# Patient Record
Sex: Female | Born: 1950 | Race: White | Hispanic: No | Smoking: Former smoker
Health system: Southern US, Community
[De-identification: ages and names within clinical notes are randomized; demographics above are authoritative.]

## PROBLEM LIST (undated history)

## (undated) HISTORY — PX: BREAST EXCISIONAL BIOPSY: SUR124

## (undated) HISTORY — PX: ABDOMINAL HYSTERECTOMY: SHX81

## (undated) HISTORY — PX: OOPHORECTOMY: SHX86

---

## 2015-12-19 DIAGNOSIS — D367 Benign neoplasm of other specified sites: Secondary | ICD-10-CM | POA: Insufficient documentation

## 2015-12-19 DIAGNOSIS — M2140 Flat foot [pes planus] (acquired), unspecified foot: Secondary | ICD-10-CM | POA: Insufficient documentation

## 2015-12-19 DIAGNOSIS — M2030 Hallux varus (acquired), unspecified foot: Secondary | ICD-10-CM | POA: Insufficient documentation

## 2017-10-18 DIAGNOSIS — M1990 Unspecified osteoarthritis, unspecified site: Secondary | ICD-10-CM | POA: Insufficient documentation

## 2017-10-18 DIAGNOSIS — N281 Cyst of kidney, acquired: Secondary | ICD-10-CM | POA: Insufficient documentation

## 2019-03-24 DIAGNOSIS — Z8709 Personal history of other diseases of the respiratory system: Secondary | ICD-10-CM | POA: Insufficient documentation

## 2019-04-10 ENCOUNTER — Telehealth: Payer: Self-pay | Admitting: Pulmonary Disease

## 2019-04-10 NOTE — Telephone Encounter (Signed)
Called patient for COVID-19 pre-screening for in office visit.  Have you recently traveled any where out of the local area in the last 2 weeks? No  Have you been in close contact with a person diagnosed with COVID-19 or someone awaiting results within the last 2 weeks? No  Do you currently have any of the following symptoms? If so, when did they start?  Cough (Yes-  occasional dry cough- couple days)       Diarrhea Joint Pain Fever      Muscle Pain   Red eyes Shortness of breath (Yes- a month)  Abdominal pain  Vomiting Loss of smell    Rash    Sore Throat Headache    Weakness   Bruising or bleeding  Denies any other symptoms and cough is very occational  Okay to proceed with visit. (date)  / Needs to reschedule visit. (date)

## 2019-04-10 NOTE — Telephone Encounter (Signed)
Okay to proceed.  

## 2019-04-13 ENCOUNTER — Other Ambulatory Visit: Payer: Self-pay

## 2019-04-13 ENCOUNTER — Ambulatory Visit: Payer: Medicare Other | Admitting: Pulmonary Disease

## 2019-04-13 ENCOUNTER — Telehealth: Payer: Self-pay

## 2019-04-13 ENCOUNTER — Encounter: Payer: Self-pay | Admitting: Pulmonary Disease

## 2019-04-13 VITALS — BP 128/74 | HR 92 | Temp 98.0°F | Ht 65.0 in | Wt 123.8 lb

## 2019-04-13 DIAGNOSIS — R0789 Other chest pain: Secondary | ICD-10-CM | POA: Diagnosis not present

## 2019-04-13 DIAGNOSIS — R0602 Shortness of breath: Secondary | ICD-10-CM | POA: Diagnosis not present

## 2019-04-13 DIAGNOSIS — R079 Chest pain, unspecified: Secondary | ICD-10-CM

## 2019-04-13 DIAGNOSIS — Z87891 Personal history of nicotine dependence: Secondary | ICD-10-CM | POA: Diagnosis not present

## 2019-04-13 MED ORDER — BREO ELLIPTA 200-25 MCG/INH IN AEPB: 1.0000 | INHALATION_SPRAY | Freq: Every day | RESPIRATORY_TRACT | 5 refills | Status: DC

## 2019-04-13 MED ORDER — BREO ELLIPTA 200-25 MCG/INH IN AEPB: 1.0000 | INHALATION_SPRAY | Freq: Every day | RESPIRATORY_TRACT | 0 refills | Status: DC

## 2019-04-13 NOTE — Progress Notes (Signed)
PULMONARY CONSULT NOTE  Requesting MD/Service: Linthavong Date of initial consultation: 04/13/19 Reason for consultation: Dyspnea, chest pressure  PT PROFILE: 68 y.o. female former smoker (20-pack-year history, quit age 57) with no prior pulmonary history referred for one month of persistent exertional dyspnea and chest pressure/tightness  DATA:  INTERVAL:  HPI:  She reports shortness of breath and chest tightness (described as "pressure") for 1 month duration.  The symptoms are present with any exertion.  They are not associated with other pulmonary symptoms.  She denies fever, cough, hemoptysis, pleurodynia.  She does not have associated diaphoresis or nausea.  The chest pressure resolves after several minutes of rest.  The symptoms have become debilitating as she is now very anxious and fearful of exerting herself.  Other than exertion, she has not been able to identify any exacerbating factors.  She has been prescribed an albuterol inhaler which she believes provides her some short-lived relief.  She has been prescribed prednisone and methylprednisolone which she believes has been beneficial.  However, on further questioning she states that these medications just "make her feel better".  It is not entirely clear that they are providing benefit with regard to her respiratory symptoms.  She believes that her symptoms relapse within a day or 2 of discontinuing systemic corticosteroids.  She notes that as a young adult, while living in [place], she had seasonal allergies and would occasionally develop shortness of breath as well.  In the remote past she has been prescribed an albuterol inhaler.  However, she is never been given a formal diagnosis of asthma.  She has no sick contacts.  She tested negative for SARS-CoV-2 on 03/28/2019.   Besides remote smoking history, she has no significant coronary risk factors.  She denies history of hypertension, hyperlipidemia, diabetes.  There is no strong  family history of coronary disease.  Her only chronic medications are Nexium and as needed albuterol inhaler.  She has no history of significant occupational or environmental exposures.  She does have dogs in the home.  She lived in [place] until 2004.  There is no significant travel history.  There is no history of tuberculosis exposure.  She reports that she had a chest x-ray performed at an urgent care center on July 10.  Per her verbal report, this only showed mild hyperinflation with no acute findings.  I do not have access to this film for review.  History reviewed. No pertinent past medical history.  History reviewed. No pertinent surgical history.  MEDICATIONS: I have reviewed all medications and confirmed regimen as documented  Social History   Socioeconomic History  . Marital status: Divorced    Spouse name: Not on file  . Number of children: Not on file  . Years of education: Not on file  . Highest education level: Not on file  Occupational History  . Not on file  Social Needs  . Financial resource strain: Not on file  . Food insecurity    Worry: Not on file    Inability: Not on file  . Transportation needs    Medical: Not on file    Non-medical: Not on file  Tobacco Use  . Smoking status: Former Smoker    Packs/day: 1.00    Years: 20.00    Pack years: 20.00    Quit date: 09/07/1988    Years since quitting: 30.6  . Smokeless tobacco: Never Used  Substance and Sexual Activity  . Alcohol use: Not on file  . Drug  use: Not on file  . Sexual activity: Not on file  Lifestyle  . Physical activity    Days per week: Not on file    Minutes per session: Not on file  . Stress: Not on file  Relationships  . Social Herbalist on phone: Not on file    Gets together: Not on file    Attends religious service: Not on file    Active member of club or organization: Not on file    Attends meetings of clubs or organizations: Not on file    Relationship  status: Not on file  . Intimate partner violence    Fear of current or ex partner: Not on file    Emotionally abused: Not on file    Physically abused: Not on file    Forced sexual activity: Not on file  Other Topics Concern  . Not on file  Social History Narrative  . Not on file    Family History  Problem Relation Age of Onset  . Asthma Brother   . Lung cancer Father        smoked  . Heart disease Father     ROS: No fever, myalgias/arthralgias, unexplained weight loss or weight gain No new focal weakness or sensory deficits No otalgia, hearing loss, visual changes, nasal and sinus symptoms, mouth and throat problems No neck pain or adenopathy No abdominal pain, N/V/D, diarrhea, change in bowel pattern No dysuria, change in urinary pattern   Vitals:   04/13/19 1017  BP: 128/74  Pulse: 92  Temp: 98 F (36.7 C)  TempSrc: Oral  SpO2: 98%  Weight: 123 lb 12.8 oz (56.2 kg)  Height: 5\' 5"  (1.651 m)     EXAM:  Gen: WDWN, No overt respiratory distress.  Somewhat anxious appearing HEENT: NCAT, sclera white, tympanic canals and membranes normal, nares patent with very mild rhinitis, oropharynx normal Neck: Supple without LAN, thyromegaly, JVD Lungs: breath sounds full without wheezes or other adventitious sounds Cardiovascular: RRR, no murmurs noted Abdomen: Soft, nontender, normal BS Ext: without clubbing, cyanosis, edema Neuro: CNs grossly intact, motor and sensory intact Skin: Limited exam, no lesions noted  DATA:   No flowsheet data found.  No flowsheet data found.  CXR: None available for my review  EKG: NSR, entirely normal  I have personally reviewed all chest radiographs reported above including CXRs and CT chest unless otherwise indicated  IMPRESSION:     ICD-10-CM   1. Chest pressure  R07.89 CANCELED: EKG 12-Lead  2. SOB (shortness of breath)  R06.02 Pulmonary Function Test [facility] Only    DG Chest 2 View  3. Former smoker  Z87.891    Differential  diagnosis includes airways disease such as asthma or asthmatic bronchitis, ischemic heart disease.  Less likely diagnoses include pulmonary hypertension and pulmonary embolism. She has no risk factors for venous thromboembolism.  She has no exam or EKG findings to suggest pulmonary hypertension.    PLAN:  Diagnostic studies ordered: 1) Myoview.  I discussed with Dr Fletcher Anon who will review this study once completed and will contact her to arrange follow up 2) PFTs (lung function test) - hopefully prior to follow-up visit 3) chest x-ray to be done prior to follow-up visit  New prescription: Breo 200-25, 1 inhalation daily.  Rinse mouth after use.  Sample provided.  Prescription entered.  Continue albuterol rescue inhaler as needed for increased shortness of breath, chest tightness, wheezing, cough  Follow-up first week of September.  Call sooner if needed    Merton Border, MD PCCM service Mobile [phone] Pager [phone] 04/13/2019 11:48 AM

## 2019-04-13 NOTE — Patient Instructions (Addendum)
Diagnostic studies ordered: 1) Myoview (heart scan). Dr Fletcher Anon will review this study once completed and will contact you to arrange follow up with him 2) PFTs (lung function test) - hopefully prior to follow-up visit 3) chest x-ray to be done prior to follow-up visit  New prescription: Breo 200-25, 1 inhalation daily.  Rinse mouth after use.  Sample provided.  Prescription entered.  Continue albuterol rescue inhaler as needed for increased shortness of breath, chest tightness, wheezing, cough  Follow-up first week of September.  Call sooner if needed

## 2019-04-13 NOTE — Telephone Encounter (Signed)
Pt is aware of date/time of covid test. Nothing further is needed.  

## 2019-04-13 NOTE — Progress Notes (Signed)
Patient of Dr.Simonds. Dr.Simmonds discussed with Dr.Arida

## 2019-04-14 ENCOUNTER — Ambulatory Visit
Admission: RE | Admit: 2019-04-14 | Discharge: 2019-04-14 | Disposition: A | Discharge status: Home / Self Care | Payer: Medicare Other | Source: Ambulatory Visit | Attending: Cardiovascular Disease | Admitting: Cardiovascular Disease

## 2019-04-14 ENCOUNTER — Other Ambulatory Visit
Admission: RE | Admit: 2019-04-14 | Discharge: 2019-04-14 | Disposition: A | Discharge status: Home / Self Care | Payer: Medicare Other | Source: Ambulatory Visit | Attending: Pulmonary Disease | Admitting: Pulmonary Disease

## 2019-04-14 DIAGNOSIS — R079 Chest pain, unspecified: Secondary | ICD-10-CM | POA: Insufficient documentation

## 2019-04-14 DIAGNOSIS — Z20828 Contact with and (suspected) exposure to other viral communicable diseases: Secondary | ICD-10-CM | POA: Insufficient documentation

## 2019-04-14 LAB — NM MYOCAR MULTI W/SPECT W/WALL MOTION / EF
Estimated workload: 1 METS
Exercise duration (min): 0 min
Exercise duration (sec): 0 s
LV dias vol: 31 mL (ref 46–106)
LV sys vol: 10 mL
MPHR: 153 {beats}/min
Peak HR: 134 {beats}/min
Percent HR: 87 %
Rest HR: 82 {beats}/min
SDS: 0
SRS: 0
SSS: 1
TID: 0.66

## 2019-04-14 MED ORDER — TECHNETIUM TC 99M TETROFOSMIN IV KIT
32.5490 | PACK | Freq: Once | INTRAVENOUS | Status: AC | PRN
  Administered 2019-04-14: 32.549 via INTRAVENOUS

## 2019-04-14 MED ORDER — TECHNETIUM TC 99M TETROFOSMIN IV KIT
10.2310 | PACK | Freq: Once | INTRAVENOUS | Status: AC | PRN
  Administered 2019-04-14: 10.231 via INTRAVENOUS

## 2019-04-14 MED ORDER — REGADENOSON 0.4 MG/5ML IV SOLN
0.4000 mg | Freq: Once | INTRAVENOUS | Status: AC
  Administered 2019-04-14: 09:00:00 0.4 mg via INTRAVENOUS

## 2019-04-15 LAB — SARS CORONAVIRUS 2 (TAT 6-24 HRS): SARS Coronavirus 2: NEGATIVE

## 2019-04-17 ENCOUNTER — Ambulatory Visit: Payer: Medicare Other | Attending: Pulmonary Disease

## 2019-04-17 ENCOUNTER — Other Ambulatory Visit: Payer: Self-pay

## 2019-04-17 DIAGNOSIS — R0602 Shortness of breath: Secondary | ICD-10-CM | POA: Insufficient documentation

## 2019-04-19 ENCOUNTER — Telehealth: Payer: Self-pay

## 2019-04-19 NOTE — Telephone Encounter (Signed)
-----   Message from Wellington Hampshire, MD sent at 04/17/2019  2:07 PM EDT ----- Inform patient that  stress test was normal.

## 2019-04-19 NOTE — Telephone Encounter (Signed)
Pt made aware of stress test results with verbalized understanding.

## 2019-04-21 ENCOUNTER — Encounter

## 2019-04-21 ENCOUNTER — Other Ambulatory Visit: Payer: Self-pay

## 2019-04-21 ENCOUNTER — Ambulatory Visit: Payer: Medicare Other | Admitting: Cardiovascular Disease

## 2019-04-21 ENCOUNTER — Encounter: Payer: Self-pay | Admitting: Cardiovascular Disease

## 2019-04-21 VITALS — BP 140/80 | HR 81 | Ht 65.0 in | Wt 124.0 lb

## 2019-04-21 DIAGNOSIS — R079 Chest pain, unspecified: Secondary | ICD-10-CM

## 2019-04-21 DIAGNOSIS — R0602 Shortness of breath: Secondary | ICD-10-CM | POA: Diagnosis not present

## 2019-04-21 NOTE — Patient Instructions (Signed)
Medication Instructions:  Your physician recommends that you continue on your current medications as directed. Please refer to the Current Medication list given to you today.  If you need a refill on your cardiac medications before your next appointment, please call your pharmacy.   Lab work: None ordered If you have labs (blood work) drawn today and your tests are completely normal, you will receive your results only by: Marland Kitchen MyChart Message (if you have MyChart) OR . A paper copy in the mail If you have any lab test that is abnormal or we need to change your treatment, we will call you to review the results.  Testing/Procedures: Your physician has requested that you have an echocardiogram. Echocardiography is a painless test that uses sound waves to create images of your heart. It provides your doctor with information about the size and shape of your heart and how well your heart's chambers and valves are working. This procedure takes approximately one hour. There are no restrictions for this procedure.    Follow-Up: At [facility], you and your health needs are our priority.  As part of our continuing mission to provide you with exceptional heart care, we have created designated Provider Care Teams.  These Care Teams include your primary Cardiologist (physician) and Advanced Practice Providers (APPs -  Physician Assistants and Nurse Practitioners) who all work together to provide you with the care you need, when you need it. You will need a follow up appointment in 2 months.  You may see  Dr.Arida  or one of the following Advanced Practice Providers on your designated Care Team:   Murray Hodgkins, NP Christell Faith, PA-C . Marrianne Mood, PA-C  Any Other Special Instructions Will Be Listed Below (If Applicable). N/A

## 2019-04-21 NOTE — Progress Notes (Signed)
Cardiology Office Note   Date:  04/21/2019   ID:  Virginia Ruiz, DOB 1951-04-02, MRN [id number]  PCP:  Dion Body, MD  Cardiologist:   Kathlyn Sacramento, MD   Chief Complaint  Patient presents with  . New Patient (Initial Visit)    Per Dr Alva Garnet urgent chest pain fu stress. Meds reviewed verbally with patient.       History of Present Illness: Virginia Ruiz is a 68 y.o. female who was referred by Dr. Alva Garnet for evaluation of exertional dyspnea and chest pain.  She has no prior cardiac history.  She has no significant risk factors for coronary artery disease other than age.  There is no history of hypertension, hyperlipidemia or diabetes.  She has no family history of coronary artery disease.  Family history is remarkable for hypertension.  She is a previous smoker but quit about 30 years ago.  She smoked about 20 years ago.  She started having shortness of breath in late June that progressed in July where she started having associated exertional chest tightness lasting for few minutes.  This is very unusual for her because she is normally very active.  There has been no orthopnea, PND or leg edema.  She went to urgent care and had a chest x-ray that only showed hyperinflation but she has been told about this in the past so nothing was acutely seen.  She was given antibiotics and prednisone with some improvement in symptoms. Given her chest pain symptoms, we proceeded with a Lexiscan Myoview which showed no evidence of ischemia with hyperdynamic LV systolic function.  She underwent pulmonary function testing which overall showed no significant obstruction or restriction.    No past medical history on file.  No past surgical history on file.   Current Outpatient Medications  Medication Sig Dispense Refill  . albuterol (VENTOLIN HFA) 108 (90 Base) MCG/ACT inhaler Inhale 2 puffs into the lungs every 4 (four) hours as needed.    . cycloSPORINE (RESTASIS) 0.05 % ophthalmic emulsion  Place 1 drop into both eyes 2 (two) times daily.    Marland Kitchen esomeprazole (NEXIUM) 20 MG capsule Take 20 mg by mouth daily.    . fluticasone furoate-vilanterol (BREO ELLIPTA) 200-25 MCG/INH AEPB Inhale 1 puff into the lungs daily. 60 each 5  . methylPREDNISolone (MEDROL DOSEPAK) 4 MG TBPK tablet 1 tablet daily     No current facility-administered medications for this visit.     Allergies:   Clindamycin, Morphine, and Tape    Social History:  The patient  reports that she quit smoking about 30 years ago. She has a 20.00 pack-year smoking history. She has never used smokeless tobacco. She reports that she does not drink alcohol or use drugs.   Family History:  The patient's family history includes Asthma in her brother; Heart disease in her father; Lung cancer in her father.    ROS:  Please see the history of present illness.   Otherwise, review of systems are positive for none.   All other systems are reviewed and negative.    PHYSICAL EXAM: VS:  BP 140/80 (BP Location: Left Arm, Patient Position: Sitting, Cuff Size: Normal)   Pulse 81   Ht 5\' 5"  (1.651 m)   Wt 124 lb (56.2 kg)   BMI 20.63 kg/m  , BMI Body mass index is 20.63 kg/m. GEN: Well nourished, well developed, in no acute distress  HEENT: normal  Neck: no JVD, carotid bruits, or masses Cardiac: RRR; no murmurs, rubs,  or gallops,no edema  Respiratory:  clear to auscultation bilaterally, normal work of breathing GI: soft, nontender, nondistended, + BS MS: no deformity or atrophy  Skin: warm and dry, no rash Neuro:  Strength and sensation are intact Psych: euthymic mood, full affect   EKG:  EKG is ordered today. The ekg ordered today demonstrates normal sinus rhythm with no significant ST or T wave changes.   Recent Labs: No results found for requested labs within last 8760 hours.    Lipid Panel No results found for: CHOL, TRIG, HDL, CHOLHDL, VLDL, LDLCALC, LDLDIRECT    Wt Readings from Last 3 Encounters:  04/21/19  124 lb (56.2 kg)  04/13/19 123 lb 12.8 oz (56.2 kg)      No flowsheet data found.    ASSESSMENT AND PLAN:  1.  Exertional dyspnea and chest pain: The exact etiology is still not entirely clear.  Cardiac exam and baseline EKG are both normal.  In addition, her Lexiscan Myoview was normal.  She has minimal risk factors for coronary artery disease other than age and previous tobacco use. Going to obtain an echocardiogram to evaluate diastolic function, valvular structure and pulmonary pressure. I asked her to continue follow-up with Dr. Alva Garnet.  If for some reason her symptoms persist with no clear explanation, a right and left cardiac catheterization might be needed in the near future for a definitive diagnosis.  2.  Elevated blood pressure without history of hypertension: Continue to monitor for now.   Disposition:   FU with me in 2 months  Signed,  Kathlyn Sacramento, MD  04/21/2019 1:22 PM    [place]

## 2019-05-09 ENCOUNTER — Ambulatory Visit: Payer: Medicare Other | Admitting: Pulmonary Disease

## 2019-05-09 ENCOUNTER — Encounter: Payer: Self-pay | Admitting: Pulmonary Disease

## 2019-05-09 ENCOUNTER — Other Ambulatory Visit: Payer: Self-pay

## 2019-05-09 VITALS — BP 126/78 | HR 80 | Temp 97.3°F | Ht 65.0 in | Wt 124.4 lb

## 2019-05-09 DIAGNOSIS — Z87891 Personal history of nicotine dependence: Secondary | ICD-10-CM

## 2019-05-09 DIAGNOSIS — R0789 Other chest pain: Secondary | ICD-10-CM | POA: Diagnosis not present

## 2019-05-09 DIAGNOSIS — R0602 Shortness of breath: Secondary | ICD-10-CM | POA: Diagnosis not present

## 2019-05-09 NOTE — Patient Instructions (Addendum)
Echocardiogram is scheduled for September 9.  I will follow-up on the results of that study  Continue albuterol inhaler as needed for increased shortness of breath, chest tightness or pressure  If you are using albuterol more than daily, begin Breo inhaler.  Sample provided last visit  Follow-up in 3 months with Dr. Patsey Berthold.  Call sooner if needed

## 2019-05-09 NOTE — Progress Notes (Signed)
PULMONARY OFFICE FOLLOW-UP NOTE  Requesting MD/Service: Linthavong Date of initial consultation: 04/13/19 Reason for consultation: Dyspnea, chest pressure  PT PROFILE: 68 y.o. female former smoker (20-pack-year history, quit age 14) with no prior pulmonary history referred for one month of persistent exertional dyspnea and chest pressure/tightness  DATA: 04/17/19 PFTs: FVC: 3.51 L (109 %pred), FEV1: 2.58 L (105 %pred), FEV1/FVC: 73%, TLC: 5.35 L (102 %pred), DLCO 75 %pred    INTERVAL: Initial consultation 04/13/2019.  At that time, PFTs were ordered with results documented above.  She was referred to cardiology for exertional chest tightness.  Cardiac evaluation including Myoview reveals no evidence of ischemic heart disease.  Echocardiogram is pending.  SUBJ:  Overall she states that she is "better".  She has less exertional dyspnea and less chest pressure.  She never started the [facility] inhaler which was provided as a sample last visit.  She has not used albuterol in the past 2 weeks.  Previously, she did not believe that albuterol helped the chest pressure.  Presently, she denies CP, fever, purulent sputum, hemoptysis, LE edema and calf tenderness.  Vitals:   05/09/19 1101  BP: 126/78  Pulse: 80  Temp: (!) 97.3 F (36.3 C)  TempSrc: Temporal  SpO2: 100%  Weight: 124 lb 6.4 oz (56.4 kg)  Height: 5\' 5"  (1.651 m)  Room air   EXAM:  Gen: NAD HEENT: NCAT, sclerae white Neck: No JVD Lungs: breath sounds full, no wheezes or other adventitious sounds Cardiovascular: RRR, no murmurs Abdomen: Soft, nontender, normal BS Ext: without clubbing, cyanosis, edema Neuro: grossly intact Skin: Limited exam, no lesions noted   DATA:   No flowsheet data found.  No flowsheet data found.  CXR: None available for my review   I have personally reviewed all chest radiographs reported above including CXRs and CT chest unless otherwise indicated  IMPRESSION:     ICD-10-CM   1. Chest  pressure  R07.89   2. SOB (shortness of breath)  R06.02   3. Former smoker  Z87.891    Most of her symptoms are improved.  Diagnostic evaluation including PFTs and Myoview cardiac study are unrevealing.  Diffusion capacity is minimally low.  This is probably an insignificant abnormality.  Echocardiogram is scheduled for September 9.  PLAN:  I will follow-up on the echocardiogram results when available.  At this point, my biggest concern would be the possibility of undiagnosed pulmonary hypertension.  Hopefully they will get a good look at right sided function  She is instructed to continue albuterol inhaler as needed for increased shortness of breath, chest tightness or pressure. If she is using albuterol more than daily, she is instructed to begin Breo inhaler.  Sample of Breo was provided last visit  Follow-up in 3 months with Dr. Patsey Berthold.  Call sooner if needed    Merton Border, MD PCCM service Mobile [phone] Pager [phone] 05/09/2019 11:05 AM

## 2019-05-17 ENCOUNTER — Other Ambulatory Visit: Payer: Self-pay

## 2019-05-17 ENCOUNTER — Ambulatory Visit (INDEPENDENT_AMBULATORY_CARE_PROVIDER_SITE_OTHER): Payer: Medicare Other

## 2019-05-17 DIAGNOSIS — R0602 Shortness of breath: Secondary | ICD-10-CM | POA: Diagnosis not present

## 2019-05-18 ENCOUNTER — Telehealth: Payer: Self-pay | Admitting: Pulmonary Disease

## 2019-05-18 NOTE — Telephone Encounter (Deleted)
Pt is aware that visit should be changed to phone visit.

## 2019-05-18 NOTE — Telephone Encounter (Signed)
Let her know that echocardiogram looks entirely normal  Thanks  Waunita Schooner

## 2019-05-18 NOTE — Telephone Encounter (Signed)
Called and spoke to pt.  Pt wanted to Dr. Alva Garnet aware that she had echo on 05/17/2019, as advised at last OV.   DS please advise. Thanks.

## 2019-05-19 ENCOUNTER — Telehealth: Payer: Self-pay

## 2019-05-19 NOTE — Telephone Encounter (Signed)
-----   Message from Wellington Hampshire, MD sent at 05/19/2019 12:22 PM EDT ----- Inform patient that echo was fine.  Normal ejection fraction with no significant valvular abnormalities.

## 2019-05-19 NOTE — Telephone Encounter (Signed)
Patient made aware of echo results with verbalized understanding. 

## 2019-05-19 NOTE — Telephone Encounter (Signed)
Patient calling back for echo results. Can be reached at [phone] (in the next hour) or [phone] afterwards

## 2019-05-19 NOTE — Telephone Encounter (Signed)
Called to give the patient echo results. [email or web address]. 

## 2019-05-19 NOTE — Telephone Encounter (Signed)
Pt is aware of results and voiced her understanding. Nothing further is needed.  

## 2019-06-23 ENCOUNTER — Other Ambulatory Visit: Payer: Self-pay

## 2019-06-23 ENCOUNTER — Encounter: Payer: Self-pay | Admitting: Cardiovascular Disease

## 2019-06-23 ENCOUNTER — Ambulatory Visit (INDEPENDENT_AMBULATORY_CARE_PROVIDER_SITE_OTHER): Payer: Medicare Other | Admitting: Cardiovascular Disease

## 2019-06-23 VITALS — BP 130/72 | HR 71 | Ht 65.0 in | Wt 125.0 lb

## 2019-06-23 DIAGNOSIS — R079 Chest pain, unspecified: Secondary | ICD-10-CM | POA: Diagnosis not present

## 2019-06-23 NOTE — Patient Instructions (Signed)
Medication Instructions:  Your physician recommends that you continue on your current medications as directed. Please refer to the Current Medication list given to you today.  *If you need a refill on your cardiac medications before your next appointment, please call your pharmacy*  Lab Work: None ordered  If you have labs (blood work) drawn today and your tests are completely normal, you will receive your results only by: Marland Kitchen MyChart Message (if you have MyChart) OR . A paper copy in the mail If you have any lab test that is abnormal or we need to change your treatment, we will call you to review the results.  Testing/Procedures: None ordered   Follow-Up: At [facility], you and your health needs are our priority.  As part of our continuing mission to provide you with exceptional heart care, we have created designated Provider Care Teams.  These Care Teams include your primary Cardiologist (physician) and Advanced Practice Providers (APPs -  Physician Assistants and Nurse Practitioners) who all work together to provide you with the care you need, when you need it.  Your next appointment:   as needed  The format for your next appointment:   In Person  Provider:   Kathlyn Sacramento, MD  Other Instructions [facility] number given to establish primary care.

## 2019-06-23 NOTE — Progress Notes (Signed)
Cardiology Office Note   Date:  06/23/2019   ID:  Virginia Ruiz, DOB 06-04-1951, MRN [id number]  PCP:  Dion Body, MD  Cardiologist:   Kathlyn Sacramento, MD   Chief Complaint  Patient presents with  . other    2 month follow up. Meds reviewed by the pt. verbally. "doing well."       History of Present Illness: Virginia Ruiz is a 68 y.o. female who is here today for follow-up visit regarding exertional dyspnea and chest pain.   There is no history of hypertension, hyperlipidemia or diabetes.  She has no family history of coronary artery disease.  Family history is remarkable for hypertension.  She is a previous smoker but quit about 30 years ago.  She was evaluated recently for exertional dyspnea with associated chest tightness.  Pulmonary work-up was unremarkable.  She underwent a Lexiscan Myoview which was normal.  Echocardiogram showed normal LV systolic and diastolic function with no significant valvular abnormalities and no evidence of pulmonary hypertension.  She reports that all her previous symptoms have resolved completely without intervention.   No past medical history on file.  No past surgical history on file.   Current Outpatient Medications  Medication Sig Dispense Refill  . albuterol (VENTOLIN HFA) 108 (90 Base) MCG/ACT inhaler Inhale 2 puffs into the lungs every 4 (four) hours as needed.    . cycloSPORINE (RESTASIS) 0.05 % ophthalmic emulsion Place 1 drop into both eyes 2 (two) times daily.    Marland Kitchen esomeprazole (NEXIUM) 20 MG capsule Take 20 mg by mouth daily.     No current facility-administered medications for this visit.     Allergies:   Clindamycin, Morphine, and Tape    Social History:  The patient  reports that she quit smoking about 30 years ago. She has a 20.00 pack-year smoking history. She has never used smokeless tobacco. She reports that she does not drink alcohol or use drugs.   Family History:  The patient's family history includes Asthma  in her brother; Heart disease in her father; Lung cancer in her father.    ROS:  Please see the history of present illness.   Otherwise, review of systems are positive for none.   All other systems are reviewed and negative.    PHYSICAL EXAM: VS:  BP 130/72 (BP Location: Left Arm, Patient Position: Sitting, Cuff Size: Normal)   Pulse 71   Ht 5\' 5"  (1.651 m)   Wt 125 lb (56.7 kg)   BMI 20.80 kg/m  , BMI Body mass index is 20.8 kg/m. GEN: Well nourished, well developed, in no acute distress  HEENT: normal  Neck: no JVD, carotid bruits, or masses Cardiac: RRR; no murmurs, rubs, or gallops,no edema  Respiratory:  clear to auscultation bilaterally, normal work of breathing GI: soft, nontender, nondistended, + BS MS: no deformity or atrophy  Skin: warm and dry, no rash Neuro:  Strength and sensation are intact Psych: euthymic mood, full affect   EKG:  EKG is ordered today. The ekg ordered today demonstrates normal sinus rhythm with no significant ST or T wave changes.   Recent Labs: No results found for requested labs within last 8760 hours.    Lipid Panel No results found for: CHOL, TRIG, HDL, CHOLHDL, VLDL, LDLCALC, LDLDIRECT    Wt Readings from Last 3 Encounters:  06/23/19 125 lb (56.7 kg)  05/09/19 124 lb 6.4 oz (56.4 kg)  04/21/19 124 lb (56.2 kg)      No flowsheet  data found.    ASSESSMENT AND PLAN:  1.  Exertional dyspnea and chest pain: Negative pulmonary and cardiac work-up including echocardiogram and stress test.  Her symptoms have resolved completely and thus no further work-up is needed.  I reviewed the results of stress testing and echocardiogram with her.  2.  Elevated blood pressure without history of hypertension: Blood pressure was elevated during her initial evaluation but is within normal limits today.   Disposition:   FU with me  as needed  Signed,  Kathlyn Sacramento, MD  06/23/2019 1:51 PM    [place]

## 2019-07-17 ENCOUNTER — Ambulatory Visit (INDEPENDENT_AMBULATORY_CARE_PROVIDER_SITE_OTHER): Payer: Medicare Other

## 2019-07-17 ENCOUNTER — Other Ambulatory Visit: Payer: Self-pay

## 2019-07-17 ENCOUNTER — Ambulatory Visit: Payer: Medicare Other | Admitting: Podiatry

## 2019-07-17 ENCOUNTER — Encounter: Payer: Self-pay | Admitting: Podiatry

## 2019-07-17 VITALS — BP 120/63 | HR 73 | Resp 16

## 2019-07-17 DIAGNOSIS — M2011 Hallux valgus (acquired), right foot: Secondary | ICD-10-CM | POA: Diagnosis not present

## 2019-07-17 DIAGNOSIS — M2141 Flat foot [pes planus] (acquired), right foot: Secondary | ICD-10-CM | POA: Insufficient documentation

## 2019-07-17 DIAGNOSIS — M2012 Hallux valgus (acquired), left foot: Secondary | ICD-10-CM

## 2019-07-17 DIAGNOSIS — H409 Unspecified glaucoma: Secondary | ICD-10-CM | POA: Insufficient documentation

## 2019-07-17 DIAGNOSIS — M810 Age-related osteoporosis without current pathological fracture: Secondary | ICD-10-CM | POA: Insufficient documentation

## 2019-07-17 DIAGNOSIS — Z9889 Other specified postprocedural states: Secondary | ICD-10-CM | POA: Insufficient documentation

## 2019-07-17 NOTE — Progress Notes (Signed)
Subjective:  Patient ID: Virginia Ruiz, female    DOB: 02/21/1951,  MRN: [id number] HPI Chief Complaint  Patient presents with  . Toe Pain    Hallux right - lateral border, tender, callused skin, redness x few weeks, 2nd toe rubbing  . Foot Pain    1st MPJ bilateral - bunion deformities-questions regarding surgery  . Callouses    Plantar forefoot bilateral - tiny callused areas  . New Patient (Initial Visit)    68 y.o. female presents with the above complaint.   ROS: Denies fever chills nausea vomiting muscle aches pains calf pain back pain chest pain shortness of breath.  No past medical history on file. No past surgical history on file.  Current Outpatient Medications:  .  NUTRITIONAL SUPPLEMENTS PO, Take by mouth., Disp: , Rfl:  .  cycloSPORINE (RESTASIS) 0.05 % ophthalmic emulsion, Place 1 drop into both eyes 2 (two) times daily., Disp: , Rfl:  .  esomeprazole (NEXIUM) 20 MG capsule, Take 20 mg by mouth daily., Disp: , Rfl:   Allergies  Allergen Reactions  . Clindamycin Rash    Other reaction(s): OTHER Other reaction(s): OTHER Caused GI upset   . Morphine Anxiety, Nausea Only, Other (See Comments) and Nausea And Vomiting    Agitation; headache Agitation; headache Caused N/V and agitation   . Tape Rash    Causes skin redness and blisters    Review of Systems Objective:   Vitals:   07/17/19 1138  BP: 120/63  Pulse: 73  Resp: 16    General: Well developed, nourished, in no acute distress, alert and oriented x3   Dermatological: Skin is warm, dry and supple bilateral. Nails x 10 are well maintained; remaining integument appears unremarkable at this time. There are no open sores, no preulcerative lesions, no rash or signs of infection present.  Vascular: Dorsalis Pedis artery and Posterior Tibial artery pedal pulses are 2/4 bilateral with immedate capillary fill time. Pedal hair growth present. No varicosities and no lower extremity edema present bilateral.    Neruologic: Grossly intact via light touch bilateral. Vibratory intact via tuning fork bilateral. Protective threshold with Semmes Wienstein monofilament intact to all pedal sites bilateral. Patellar and Achilles deep tendon reflexes 2+ bilateral. No Babinski or clonus noted bilateral.   Musculoskeletal: No gross boney pedal deformities bilateral. No pain, crepitus, or limitation noted with foot and ankle range of motion bilateral. Muscular strength 5/5 in all groups tested bilateral.  Hallux abductovalgus deformity bilateral right greater than left resulting in pain to the second digit as well as the the hallux secondary to his juxtaposition.  This is resulting in reactive hyperkeratotic lesion lateral aspect nail border right hallux.  Medial deviation of the second and third toes of the right foot.  Gait: Unassisted, Nonantalgic.    Radiographs:  Radiographs taken today demonstrate increase in the first intermetatarsal angle and hallux abductus angle.  Mild hammertoe deformities noted.  No significant acute findings.  Assessment & Plan:   Assessment: Hallux valgus deformity with hammertoe deformities and reactive hyperkeratotic lesion lateral border hallux right.  Plan: Discussed etiology pathology conservative versus surgical therapies at this point discussed the surgical intervention with her in great detail she understands that and is amenable to it did discuss the possibility of a simple matrixectomy to help alleviate some of the pressure from that hallux nail plate.  Also provided her with a silicone digital pads to help alleviate her symptoms.  Follow-up with her in 1 month for consult primarily bunion repair  right foot with a release of the second and third metatarsal phalangeal joints.     Virginia Ruiz T. [place], [place]

## 2019-08-09 ENCOUNTER — Encounter: Payer: Self-pay | Admitting: Pulmonary Disease

## 2019-08-09 ENCOUNTER — Ambulatory Visit: Payer: Medicare Other | Admitting: Pulmonary Disease

## 2019-08-09 ENCOUNTER — Ambulatory Visit (INDEPENDENT_AMBULATORY_CARE_PROVIDER_SITE_OTHER): Payer: Medicare Other | Admitting: Pulmonary Disease

## 2019-08-09 DIAGNOSIS — R0602 Shortness of breath: Secondary | ICD-10-CM

## 2019-08-09 DIAGNOSIS — Z87891 Personal history of nicotine dependence: Secondary | ICD-10-CM

## 2019-08-09 DIAGNOSIS — Z7189 Other specified counseling: Secondary | ICD-10-CM | POA: Diagnosis not present

## 2019-08-09 DIAGNOSIS — R0789 Other chest pain: Secondary | ICD-10-CM

## 2019-08-09 NOTE — Progress Notes (Signed)
   Subjective:    Patient ID: Virginia Ruiz, female    DOB: Apr 03, 1951, 68 y.o.   MRN: [id number] Virtual Visit Via Video or Telephone Note:   This visit type was conducted due to national recommendations for restrictions regarding the COVID-19 pandemic .  This format is felt to be most appropriate for this patient at this time.  All issues noted in this document were discussed and addressed.  No physical exam was performed (except for noted visual exam findings with Video Visits).    I connected with Virginia Ruiz  by  telephone at 10:22  and verified that I was speaking with the correct person using two identifiers. Location patient: home Location provider: [facility] Persons participating in the virtual visit: patient, physician   I discussed the limitations, risks, security and privacy concerns of performing an evaluation and management service by video and the availability of in person appointments. The patient expressed understanding and agreed to proceed.  Requesting MD/Service: Arida/cardiology, patient's primary physician: [place] Date of initial consultation: 04/13/19 by Dr. Jamal Collin  Reason for consultation: Dyspnea, chest pressure  PT PROFILE: 68 y.o. female former smoker (20-pack-year history, quit age 38) with no prior pulmonary history referred for one month of persistent exertional dyspnea and chest pressure/tightness  DATA: 04/17/19 PFTs: FVC: 3.51 L (109 %pred), FEV1: 2.58 L (105 %pred), FEV1/FVC: 73%, TLC: 5.35 L (102 %pred), DLCO 75 %pred.  Basically normal with very mild diffusion capacity impairment.   INTERVAL: Last visit with Dr. Alva Garnet was 05/09/2019.  At that time, PFTs were ordered with results documented above.  She was referred to cardiology for exertional chest tightness.  Cardiac evaluation including Myoview reveals no evidence of ischemic heart disease.  Echocardiogram is normal, no evidence of increased right-sided pressures.  HPI Since  her last visit she reports that her symptoms have fully corrected.  She has had no issues on exertion.  No chest tightness shortness of breath.  She never used [employer].  All of her diagnostic tests have been essentially normal.  No evidence of pulmonary hypertension on her echocardiogram.  She has not had to use albuterol inhaler at all.  No complaints voiced.   Review of Systems A 10 point review of systems was performed and it is as noted above otherwise negative.    Objective:   Physical Exam  Physical exam performed as this was a remote visit via telephone.  Patient was speaking in complete sentences without evidence of breathlessness.     Assessment & Plan:   1.  Exertional chest tightness and dyspnea (shortness of breath): This issue has resolved spontaneously, the patient is asymptomatic at present.  2.  Former smoker: Patient has not had relapse.  3.  COVID-19 advice: Patient was provided updated guidelines with regards to COVID-19 pandemic.  Her questions were answered to her satisfaction.   Disposition: Follow-up on an as-needed basis.  Total telephone visit time 11 minutes.   Renold Don, MD [facility]   This note was dictated using voice recognition software/Dragon.  Despite best efforts to proofread, errors can occur which can change the meaning.  Any change was purely unintentional.

## 2019-08-09 NOTE — Patient Instructions (Signed)
1. Follow up as needed

## 2019-08-28 ENCOUNTER — Ambulatory Visit: Payer: Medicare Other | Admitting: Podiatry

## 2019-08-28 ENCOUNTER — Other Ambulatory Visit: Payer: Self-pay

## 2019-08-28 DIAGNOSIS — M2041 Other hammer toe(s) (acquired), right foot: Secondary | ICD-10-CM

## 2019-08-28 DIAGNOSIS — M2011 Hallux valgus (acquired), right foot: Secondary | ICD-10-CM

## 2019-08-28 NOTE — Progress Notes (Signed)
  Subjective:  Patient ID: Virginia Ruiz, female    DOB: 1950-12-30,  MRN: [id number] HPI No chief complaint on file.   68 y.o. female presents with the above complaint.   She presents today chief complaint of painful bunion and second and meta tarsal phalangeal joint and third metatarsophalangeal joint of the right foot.  ROS: Denies fever chills nausea vomiting muscle aches and pains.  No past medical history on file. No past surgical history on file.  Current Outpatient Medications:  .  cycloSPORINE (RESTASIS) 0.05 % ophthalmic emulsion, Place 1 drop into both eyes 2 (two) times daily., Disp: , Rfl:  .  esomeprazole (NEXIUM) 20 MG capsule, Take 20 mg by mouth daily., Disp: , Rfl:  .  NUTRITIONAL SUPPLEMENTS PO, Take by mouth., Disp: , Rfl:   Allergies  Allergen Reactions  . Clindamycin Rash    Other reaction(s): OTHER Other reaction(s): OTHER Caused GI upset   . Morphine Anxiety, Nausea Only, Other (See Comments) and Nausea And Vomiting    Agitation; headache Agitation; headache Caused N/V and agitation   . Tape Rash    Causes skin redness and blisters    Review of Systems Objective:  There were no vitals filed for this visit.  General: Well developed, nourished, in no acute distress, alert and oriented x3   Dermatological: Skin is warm, dry and supple bilateral. Nails x 10 are well maintained; remaining integument appears unremarkable at this time. There are no open sores, no preulcerative lesions, no rash or signs of infection present.  Vascular: Dorsalis Pedis artery and Posterior Tibial artery pedal pulses are 2/4 bilateral with immedate capillary fill time. Pedal hair growth present. No varicosities and no lower extremity edema present bilateral.   Neruologic: Grossly intact via light touch bilateral. Vibratory intact via tuning fork bilateral. Protective threshold with Semmes Wienstein monofilament intact to all pedal sites bilateral. Patellar and Achilles deep  tendon reflexes 2+ bilateral. No Babinski or clonus noted bilateral.   Musculoskeletal: No gross boney pedal deformities bilateral. No pain, crepitus, or limitation noted with foot and ankle range of motion bilateral. Muscular strength 5/5 in all groups tested bilateral.  Pain on palpation of the dorsal medial aspect of the first metatarsophalangeal joint of the right foot mild flexible hammertoe deformity second third right with contracted metatarsophalangeal joints.  Gait: Unassisted, Nonantalgic.    Radiographs:  Radiographs reviewed today demonstrate increase in the first intermetatarsal angle greater than normal value.  Bilaterally right greater than left hypertrophic medial condyle and a slightly elongated second and third metatarsals.  Mild hammertoe deformities also noted.  Assessment & Plan:   Assessment: Hallux abductovalgus deformity contracted second and third metatarsophalangeal joints right foot.  Plan: Discussed etiology pathology conservative surgical therapies at this point time consented her today for an [facility] bunion repair with a release of the second and third metatarsophalangeal joints of the right foot.  Tolerated procedure well without complications.  We did discuss the possible postop complications which may include but not limited to postop pain bleeding swelling flexion recurrence need for further surgery overcorrection under correction loss of digit loss of limb loss of life.     Denesha Brouse T. [place], [place]

## 2019-08-28 NOTE — Patient Instructions (Signed)
Pre-Operative Instructions  Congratulations, you have decided to take an important step towards improving your quality of life.  You can be assured that the doctors and staff at [facility] will be with you every step of the way.  Here are some important things you should know:  1. Plan to be at the surgery center/hospital at least 1 (one) hour prior to your scheduled time, unless otherwise directed by the surgical center/hospital staff.  You must have a responsible adult accompany you, remain during the surgery and drive you home.  Make sure you have directions to the surgical center/hospital to ensure you arrive on time. 2. If you are having surgery at [facility] or [facility], you will need a copy of your medical history and physical form from your family physician within one month prior to the date of surgery. We will give you a form for your primary physician to complete.  3. We make every effort to accommodate the date you request for surgery.  However, there are times where surgery dates or times have to be moved.  We will contact you as soon as possible if a change in schedule is required.   4. No aspirin/ibuprofen for one week before surgery.  If you are on aspirin, any non-steroidal anti-inflammatory medications (Mobic, Aleve, Ibuprofen) should not be taken seven (7) days prior to your surgery.  You make take Tylenol for pain prior to surgery.  5. Medications - If you are taking daily heart and blood pressure medications, seizure, reflux, allergy, asthma, anxiety, pain or diabetes medications, make sure you notify the surgery center/hospital before the day of surgery so they can tell you which medications you should take or avoid the day of surgery. 6. No food or drink after midnight the night before surgery unless directed otherwise by surgical center/hospital staff. 7. No alcoholic beverages 24-hours prior to surgery.  No smoking 24-hours prior or 24-hours after  surgery. 8. Wear loose pants or shorts. They should be loose enough to fit over bandages, boots, and casts. 9. Don't wear slip-on shoes. Sneakers are preferred. 10. Bring your boot with you to the surgery center/hospital.  Also bring crutches or a walker if your physician has prescribed it for you.  If you do not have this equipment, it will be provided for you after surgery. 11. If you have not been contacted by the surgery center/hospital by the day before your surgery, call to confirm the date and time of your surgery. 12. Leave-time from work may vary depending on the type of surgery you have.  Appropriate arrangements should be made prior to surgery with your employer. 13. Prescriptions will be provided immediately following surgery by your doctor.  Fill these as soon as possible after surgery and take the medication as directed. Pain medications will not be refilled on weekends and must be approved by the doctor. 14. Remove nail polish on the operative foot and avoid getting pedicures prior to surgery. 15. Wash the night before surgery.  The night before surgery wash the foot and leg well with water and the antibacterial soap provided. Be sure to pay special attention to beneath the toenails and in between the toes.  Wash for at least three (3) minutes. Rinse thoroughly with water and dry well with a towel.  Perform this wash unless told not to do so by your physician.  Enclosed: 1 Ice pack (please put in freezer the night before surgery)   1 Hibiclens skin cleaner     Pre-op instructions  If you have any questions regarding the instructions, please do not hesitate to call our office.  [place]: [address], [place], [place] [address] -- [phone]  [place]: [address] [place], [place] [address] -- [phone]  [place]: [address], [place], [place] [address] -- [phone]   Website: [email or web address] 

## 2019-12-03 ENCOUNTER — Ambulatory Visit: Payer: Medicare PPO | Attending: Internal Medicine

## 2019-12-03 DIAGNOSIS — Z23 Encounter for immunization: Secondary | ICD-10-CM

## 2019-12-03 NOTE — Progress Notes (Signed)
   Covid-19 Vaccination Clinic  Name:  Keunna Appiah    MRN: [id number] DOB: Nov 18, 1950  12/03/2019  Ms. Fluty was observed post Covid-19 immunization for 15 minutes without incident. She was provided with Vaccine Information Sheet and instruction to access the V-Safe system.   Ms. Wride was instructed to call 911 with any severe reactions post vaccine: Marland Kitchen Difficulty breathing  . Swelling of face and throat  . A fast heartbeat  . A bad rash all over body  . Dizziness and weakness   Immunizations Administered    Name Date Dose VIS Date Route   Pfizer COVID-19 Vaccine 12/03/2019  4:03 PM 0.3 mL 08/18/2019 Intramuscular   Manufacturer: [place]   Lot: [id number]   [place]: [id number]

## 2019-12-24 ENCOUNTER — Ambulatory Visit: Payer: Medicare PPO | Attending: Internal Medicine

## 2019-12-24 DIAGNOSIS — Z23 Encounter for immunization: Secondary | ICD-10-CM

## 2019-12-24 NOTE — Progress Notes (Signed)
   Covid-19 Vaccination Clinic  Name:  Shaquay Perlstein    MRN: [id number] DOB: 1951-09-01  12/24/2019  Ms. Hulce was observed post Covid-19 immunization for 15 minutes without incident. She was provided with Vaccine Information Sheet and instruction to access the V-Safe system.   Ms. Raison was instructed to call 911 with any severe reactions post vaccine: Marland Kitchen Difficulty breathing  . Swelling of face and throat  . A fast heartbeat  . A bad rash all over body  . Dizziness and weakness   Immunizations Administered    Name Date Dose VIS Date Route   Pfizer COVID-19 Vaccine 12/24/2019  3:36 PM 0.3 mL 08/18/2019 Intramuscular   Manufacturer: [place]   Lot: [id number]   [place]: [id number]

## 2020-09-18 ENCOUNTER — Other Ambulatory Visit: Payer: Self-pay | Admitting: Family Medicine

## 2020-09-18 DIAGNOSIS — Z1231 Encounter for screening mammogram for malignant neoplasm of breast: Secondary | ICD-10-CM

## 2020-10-15 ENCOUNTER — Other Ambulatory Visit: Payer: Self-pay

## 2020-10-15 ENCOUNTER — Ambulatory Visit
Admission: RE | Admit: 2020-10-15 | Discharge: 2020-10-15 | Disposition: A | Discharge status: Home / Self Care | Payer: Medicare PPO | Source: Ambulatory Visit | Attending: Family Medicine | Admitting: Family Medicine

## 2020-10-15 DIAGNOSIS — Z1231 Encounter for screening mammogram for malignant neoplasm of breast: Secondary | ICD-10-CM | POA: Diagnosis present

## 2020-10-22 ENCOUNTER — Other Ambulatory Visit: Payer: Self-pay | Admitting: Family Medicine

## 2020-10-22 DIAGNOSIS — R928 Other abnormal and inconclusive findings on diagnostic imaging of breast: Secondary | ICD-10-CM

## 2020-10-22 DIAGNOSIS — R921 Mammographic calcification found on diagnostic imaging of breast: Secondary | ICD-10-CM

## 2020-10-24 ENCOUNTER — Other Ambulatory Visit: Payer: Self-pay | Admitting: Family Medicine

## 2020-10-24 ENCOUNTER — Ambulatory Visit
Admission: RE | Admit: 2020-10-24 | Discharge: 2020-10-24 | Disposition: A | Discharge status: Home / Self Care | Payer: Medicare PPO | Source: Ambulatory Visit | Attending: Family Medicine | Admitting: Family Medicine

## 2020-10-24 ENCOUNTER — Other Ambulatory Visit: Payer: Self-pay

## 2020-10-24 DIAGNOSIS — R921 Mammographic calcification found on diagnostic imaging of breast: Secondary | ICD-10-CM | POA: Diagnosis present

## 2020-10-24 DIAGNOSIS — R928 Other abnormal and inconclusive findings on diagnostic imaging of breast: Secondary | ICD-10-CM | POA: Insufficient documentation

## 2020-10-29 ENCOUNTER — Other Ambulatory Visit: Payer: Self-pay | Admitting: Family Medicine

## 2020-10-29 DIAGNOSIS — R921 Mammographic calcification found on diagnostic imaging of breast: Secondary | ICD-10-CM

## 2021-06-16 ENCOUNTER — Ambulatory Visit
Admission: RE | Admit: 2021-06-16 | Discharge: 2021-06-16 | Disposition: A | Discharge status: Home / Self Care | Payer: Medicare PPO | Source: Ambulatory Visit | Attending: Family Medicine | Admitting: Family Medicine

## 2021-06-16 ENCOUNTER — Other Ambulatory Visit: Payer: Self-pay

## 2021-06-16 DIAGNOSIS — R921 Mammographic calcification found on diagnostic imaging of breast: Secondary | ICD-10-CM

## 2021-06-17 ENCOUNTER — Other Ambulatory Visit: Payer: Self-pay | Admitting: Family Medicine

## 2021-06-17 DIAGNOSIS — R921 Mammographic calcification found on diagnostic imaging of breast: Secondary | ICD-10-CM

## 2021-06-17 DIAGNOSIS — Z1231 Encounter for screening mammogram for malignant neoplasm of breast: Secondary | ICD-10-CM

## 2021-06-17 DIAGNOSIS — R928 Other abnormal and inconclusive findings on diagnostic imaging of breast: Secondary | ICD-10-CM

## 2022-03-20 ENCOUNTER — Ambulatory Visit
Admission: RE | Admit: 2022-03-20 | Discharge: 2022-03-20 | Disposition: A | Discharge status: Home / Self Care | Payer: Medicare PPO | Source: Ambulatory Visit | Attending: Family Medicine | Admitting: Family Medicine

## 2022-03-20 DIAGNOSIS — R921 Mammographic calcification found on diagnostic imaging of breast: Secondary | ICD-10-CM | POA: Diagnosis present

## 2022-03-20 DIAGNOSIS — R928 Other abnormal and inconclusive findings on diagnostic imaging of breast: Secondary | ICD-10-CM | POA: Diagnosis present

## 2022-03-20 DIAGNOSIS — Z1231 Encounter for screening mammogram for malignant neoplasm of breast: Secondary | ICD-10-CM | POA: Diagnosis present

## 2022-04-30 IMAGING — MG MM DIGITAL DIAGNOSTIC UNILAT*R* W/ TOMO W/ CAD
6 series · 6 of 14 positions shown · non-contrast
Comparison: Previous exam(s).

CLINICAL DATA: Short-term follow-up for probably benign right
breast calcifications, initially assessed with diagnostic imaging on
10/24/2020.

EXAM:
DIGITAL DIAGNOSTIC UNILATERAL RIGHT MAMMOGRAM WITH TOMOSYNTHESIS AND
CAD
TECHNIQUE: Right digital diagnostic mammography and breast tomosynthesis was
performed. The images were evaluated with computer-aided detection.

[R CC]
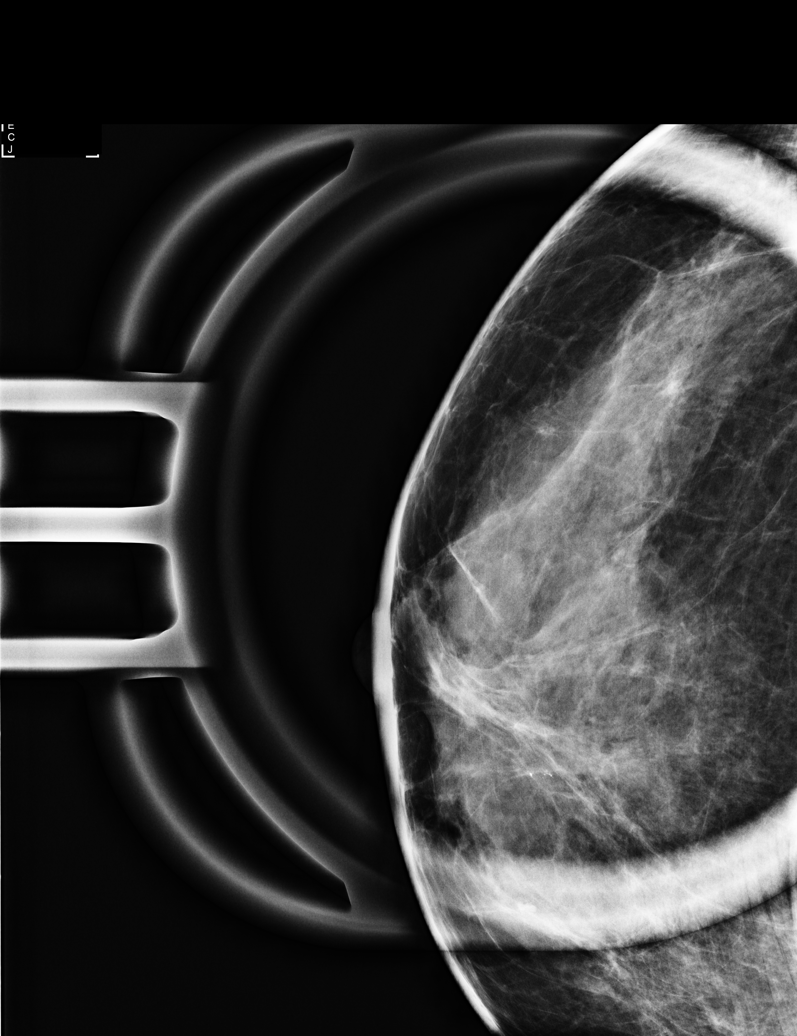

[R ML]
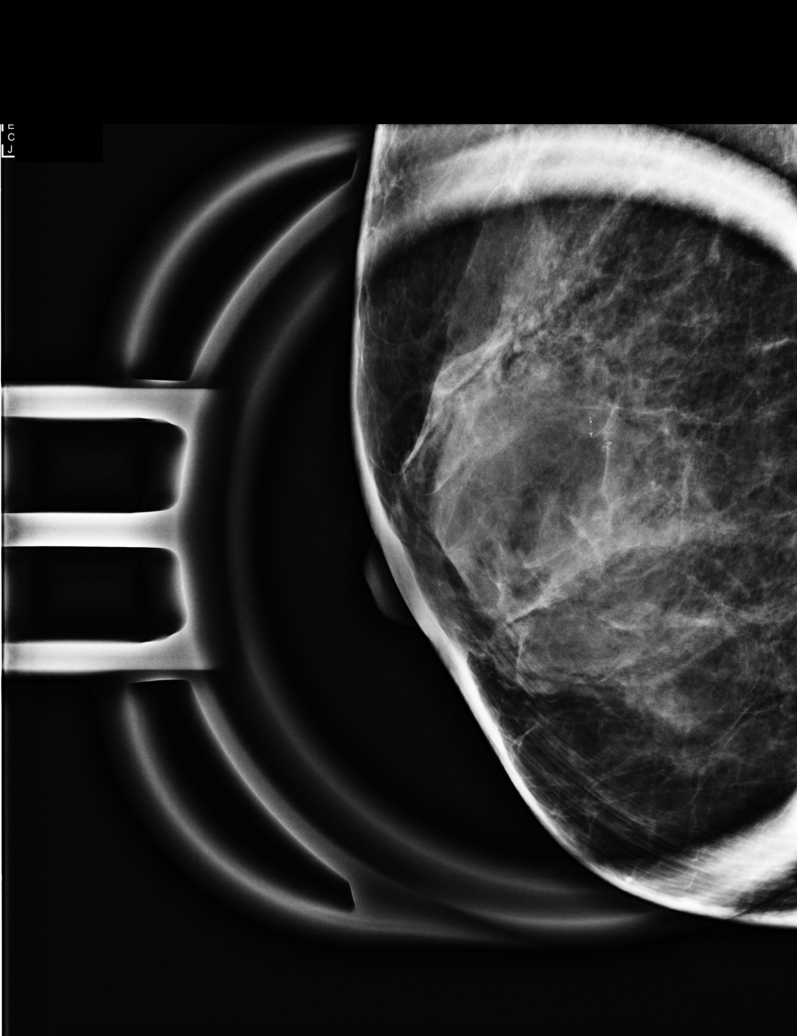

[R CC synth-2D]
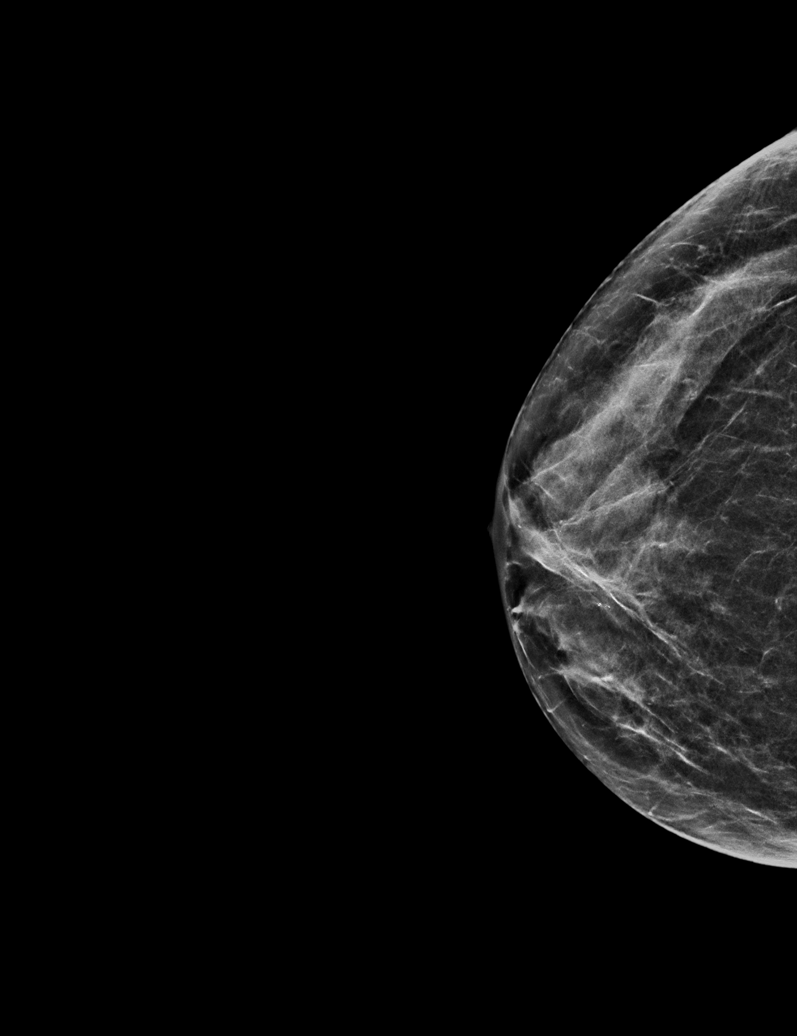

[R MLO synth-2D]
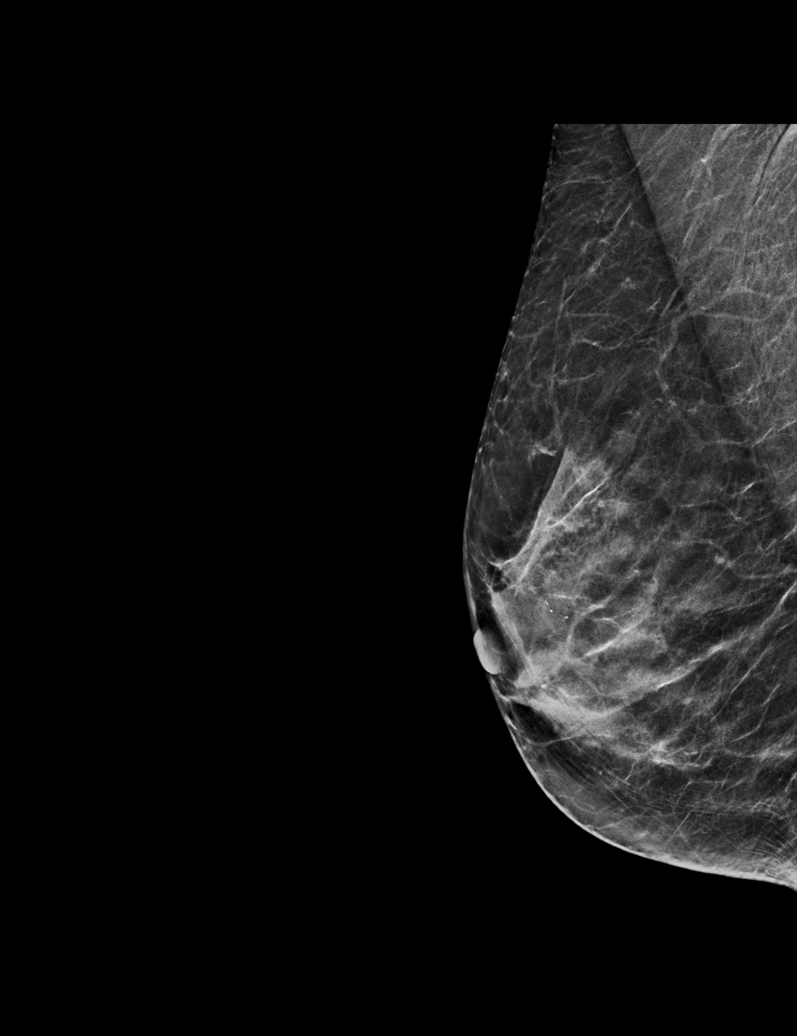

[R MLO tomo · tomo slice 24/47.0]
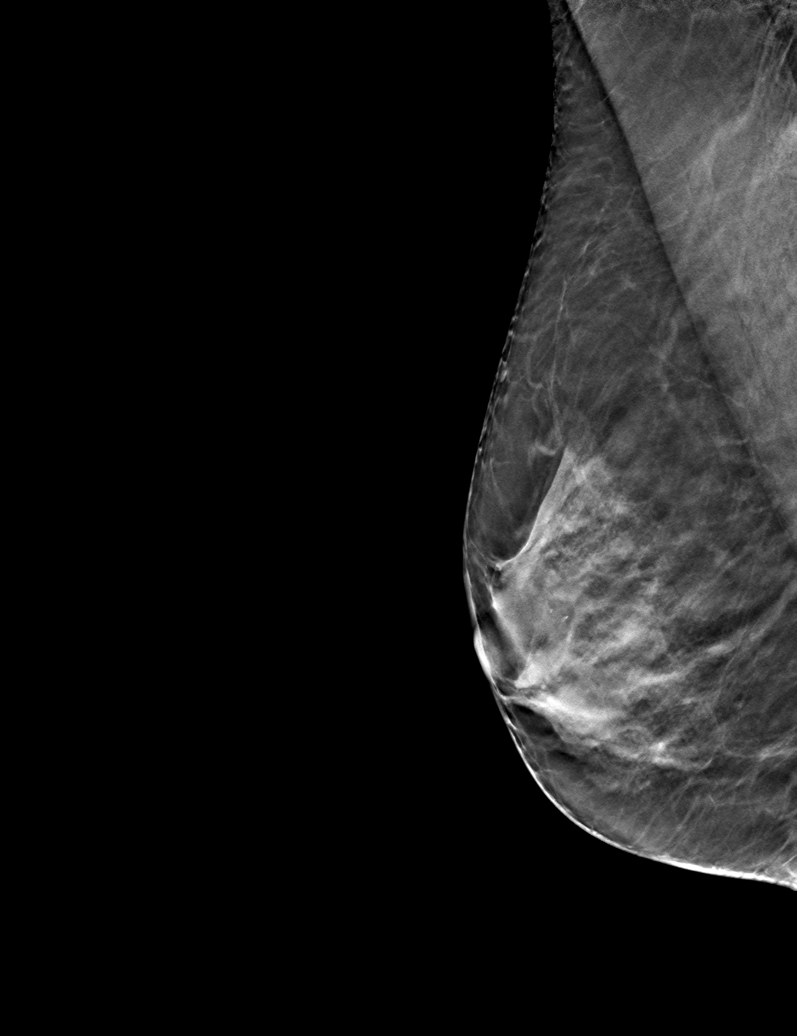

[R CC tomo · tomo slice 25/49.0]
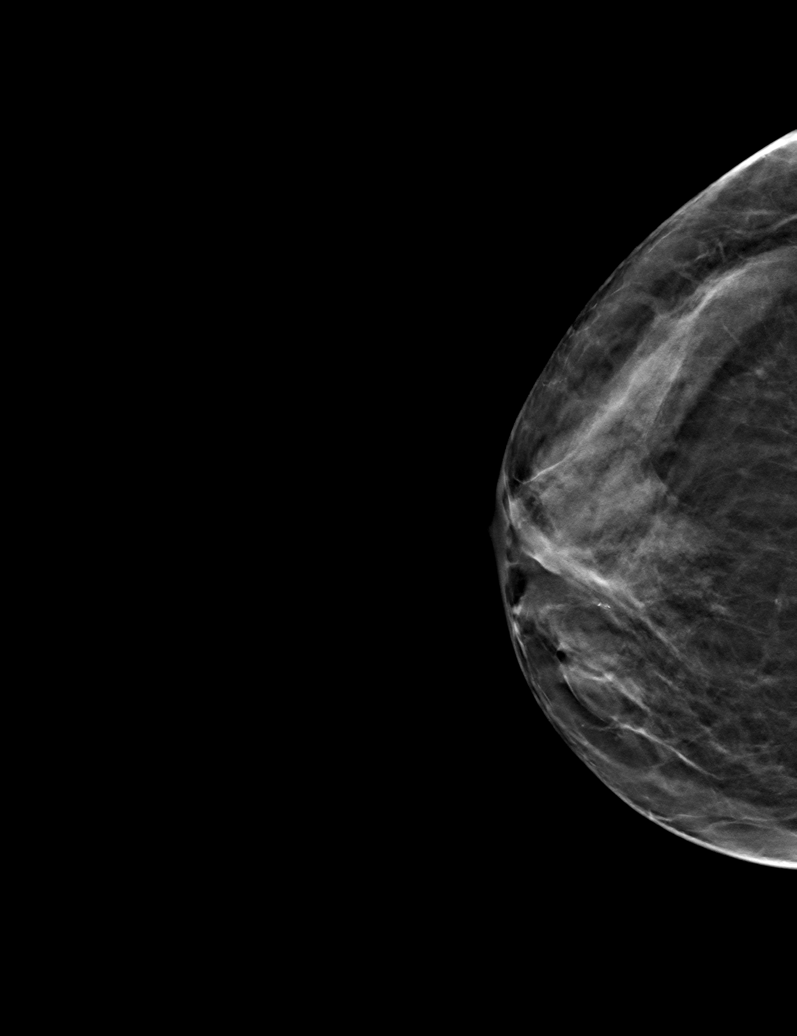

[6 of 14 positions shown; findings below may reference images not displayed]

ACR Breast Density Category c: The breast tissue is heterogeneously
dense, which may obscure small masses.
FINDINGS: The small group of calcifications in the right breast, central
medial location, are unchanged.

There are no masses or areas of nonsurgical architectural
distortion. There are no new calcifications.
IMPRESSION: 1. Probably benign right breast calcifications stable for 6 months.
Additional short-term follow-up recommended.

RECOMMENDATION:
Diagnostic mammography with right breast magnification views in 6
months.

I have discussed the findings and recommendations with the patient.
If applicable, a reminder letter will be sent to the patient
regarding the next appointment.

BI-RADS CATEGORY  3: Probably benign.

## 2022-09-22 ENCOUNTER — Encounter: Payer: Self-pay | Admitting: Family Medicine

## 2022-09-24 ENCOUNTER — Other Ambulatory Visit: Payer: Self-pay | Admitting: Family Medicine

## 2022-09-24 DIAGNOSIS — R921 Mammographic calcification found on diagnostic imaging of breast: Secondary | ICD-10-CM

## 2023-04-01 ENCOUNTER — Other Ambulatory Visit: Payer: Self-pay

## 2023-04-01 ENCOUNTER — Emergency Department: Payer: Medicare PPO

## 2023-04-01 ENCOUNTER — Emergency Department
Admission: EM | Admit: 2023-04-01 | Discharge: 2023-04-01 | Disposition: A | Discharge status: Home / Self Care | Payer: Medicare PPO | Attending: Emergency Medicine | Admitting: Emergency Medicine

## 2023-04-01 ENCOUNTER — Encounter: Payer: Self-pay | Admitting: Emergency Medicine

## 2023-04-01 DIAGNOSIS — Z85830 Personal history of malignant neoplasm of bone: Secondary | ICD-10-CM | POA: Diagnosis not present

## 2023-04-01 DIAGNOSIS — W010XXA Fall on same level from slipping, tripping and stumbling without subsequent striking against object, initial encounter: Secondary | ICD-10-CM | POA: Insufficient documentation

## 2023-04-01 DIAGNOSIS — S99911A Unspecified injury of right ankle, initial encounter: Secondary | ICD-10-CM | POA: Insufficient documentation

## 2023-04-01 NOTE — Discharge Instructions (Signed)
You have a small avulsion fracture off of your lateral malleolus.  Please wear the cam boot, and follow-up with orthopedics.  In the meantime, rest, ice, elevate your ankle.  Please return for any new, worsening, or change in symptoms or other concerns.  It was a pleasure caring for you today.

## 2023-04-01 NOTE — ED Provider Notes (Signed)
[facility] Provider Note    Event Date/Time   First MD Initiated Contact with Patient 04/01/23 [phone]     (approximate)   History   Fall   HPI  Virginia Ruiz is a 72 y.o. female with a past medical history of osteoporosis, emphysema, arthritis who presents today for evaluation after a trip and fall.  She reports that she had shoes on that were too big and she slipped out of the shoe and twisted her right ankle.  She did not strike her head or lose consciousness.  She denies any other injury sustained.  She reports that her husband put her in a Sam splint and brought her to the emergency department.  She reports that she has been able to put very minimal weight on her ankle.  No paresthesias or weakness.    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   S/P colonoscopy 07/17/2019   History of emphysema (HCC) 03/24/2019   Arthritis 10/18/2017   Renal cyst, left 10/18/2017   Acquired hallux malleus 12/19/2015   Benign neoplasm of foot 12/19/2015   Pes planus 12/19/2015   Hyperlipidemia, unspecified 08/18/2012   Osteoporosis 08/27/2010   Gastro-esophageal reflux disease with esophagitis 06/23/2010   Diffuse cystic mastopathy 03/06/2010          Physical Exam   Triage Vital Signs: ED Triage Vitals  Encounter Vitals Group     BP 04/01/23 0832 (!) 173/90     Systolic BP Percentile --      Diastolic BP Percentile --      Pulse Rate 04/01/23 0832 90     Resp 04/01/23 0832 14     Temp 04/01/23 0832 98.3 F (36.8 C)     Temp src --      SpO2 04/01/23 0832 97 %     Weight 04/01/23 0831 125 lb (56.7 kg)     Height 04/01/23 0831 5\' 5"  (1.651 m)     Head Circumference --      Peak Flow --      Pain Score 04/01/23 0833 5     Pain Loc --      Pain Education --      Exclude from  Growth Chart --     Most recent vital signs: Vitals:   04/01/23 0832  BP: (!) 173/90  Pulse: 90  Resp: 14  Temp: 98.3 F (36.8 C)  SpO2: 97%    Physical Exam Vitals and nursing note reviewed.  Constitutional:      General: Awake and alert. No acute distress.    Appearance: Normal appearance. The patient is normal weight.  HENT:     Head: Normocephalic and atraumatic.     Mouth: Mucous membranes are moist.  Eyes:     General: PERRL. Normal EOMs        Right eye: No discharge.        Left eye: No discharge.     Conjunctiva/sclera: Conjunctivae normal.  Cardiovascular:     Rate and Rhythm: Normal rate and regular rhythm.     Pulses: Normal pulses.     Heart sounds: Normal heart sounds Pulmonary:     Effort: Pulmonary effort is normal. No respiratory distress.     Breath sounds: Normal breath sounds.  Abdominal:     Abdomen is soft.  There is no abdominal tenderness. No rebound or guarding. No distention. Musculoskeletal:        General: No swelling. Normal range of motion.     Cervical back: Normal range of motion and neck supple.  Right ankle: Tenderness and swelling over the lateral malleolus with tenderness to palpation.there is also mild medial malleolar tenderness without swelling. No proximal fifth metacarpal tenderness. No proximal fibular tenderness. 2+ pedal pulses with brisk capillary refill. Intact distal sensation and strength with normal ROM. Able to plantar flex and dorsiflex against resistance. Able to invert and evert against resistance. Negative  dorsiflexion external rotation test. Negative squeeze test. Negative Thompson test. Compartments are soft and compressible throughout. Normal sensation to light touch and equal to opposite Skin:    General: Skin is warm and dry.     Capillary Refill: Capillary refill takes less than 2 seconds.     Findings: No rash.  Neurological:     Mental Status: The patient is awake and alert.      ED Results / Procedures /  Treatments   Labs (all labs ordered are listed, but only abnormal results are displayed) Labs Reviewed - No data to display   EKG     RADIOLOGY I independently reviewed and interpreted imaging and agree with radiologists findings.     PROCEDURES:  Critical Care performed:   Procedures   MEDICATIONS ORDERED IN ED: Medications - No data to display   IMPRESSION / MDM / ASSESSMENT AND PLAN / ED COURSE  I reviewed the triage vital signs and the nursing notes.   Differential diagnosis includes, but is not limited to, fracture, dislocation, sprain, ligament injury.  Patient is awake and alert, hemodynamically stable and neurovascularly intact.  She has normal 2+ pedal pulses, normal capillary refill.  Her sensation is intact light touch throughout.  Compartments are soft and compressible throughout.  She has obvious swelling over her lateral malleolus.  X-ray obtained reveals a 2 mm ossicle just distal to the fibula which may represent an acute avulsion injury.  Given that this is the location of her pain and swelling, I suspect this is an acute avulsion injury.  She has no proximal fibular tenderness or evidence of Maisonneuve fracture on x-ray.  Her foot x-ray does not reveal any acute osseous injury.   There is no knee pain or swelling and no ligamental laxity, do not suspect knee injury.  There is no proximal fifth metatarsal tenderness concerning for Jones fracture.  Negative squeeze test so do not suspect high ankle sprain.  Negative Thompson test, do not suspect Achilles tendon rupture. Patient is able to bear weight with pain.    Patient was given cam boot.  She is able to bear weight as tolerated, and I advised that she follow-up with orthopedics.  The appropriate follow-up information was provided.  We discussed Rice in the meantime.  Patient declined analgesia in the emergency department..  Patient understands and agrees with plan.  She was discharged in stable condition  with her significant other.   Patient's presentation is most consistent with acute complicated illness / injury requiring diagnostic workup.   Clinical Course as of 04/01/23 1404  Thu Apr 01, 2023  1031 IMPRESSION: 1. Age-indeterminate 2 mm ossicle just distal to the fibula. Given moderate to high-grade lateral malleolar soft tissue swelling, this may represent an acute avulsion injury. 2. Moderate lateral compartment osteoarthritis at the right knee.   [JP]    Clinical Course User Index [JP] Alvy Beal  E, PA-C     FINAL CLINICAL IMPRESSION(S) / ED DIAGNOSES   Final diagnoses:  Ankle injury, right, initial encounter     Rx / DC Orders   ED Discharge Orders     None        Note:  This document was prepared using Dragon voice recognition software and may include unintentional dictation errors.   Keturah Shavers 04/01/23 1404    Jene Every, MD 04/01/23 [phone]

## 2023-04-01 NOTE — ED Triage Notes (Signed)
Pt here after falling this morning. Pt states had on a relatives shoes that were too big and slipped out of the shoe and landed on her face. Pt states she twisted her right ankle, pt arrives with ankle in brace.

## 2023-04-05 ENCOUNTER — Ambulatory Visit
Admission: RE | Admit: 2023-04-05 | Discharge: 2023-04-05 | Disposition: A | Discharge status: Home / Self Care | Payer: Medicare PPO | Source: Ambulatory Visit | Attending: Family Medicine | Admitting: Family Medicine

## 2023-04-05 DIAGNOSIS — R921 Mammographic calcification found on diagnostic imaging of breast: Secondary | ICD-10-CM | POA: Insufficient documentation

## 2024-07-18 ENCOUNTER — Other Ambulatory Visit: Payer: Self-pay | Admitting: Family Medicine

## 2024-07-18 DIAGNOSIS — Z1231 Encounter for screening mammogram for malignant neoplasm of breast: Secondary | ICD-10-CM

## 2024-08-01 ENCOUNTER — Other Ambulatory Visit: Payer: Self-pay | Admitting: Family Medicine

## 2024-08-01 DIAGNOSIS — F0150 Vascular dementia without behavioral disturbance: Secondary | ICD-10-CM

## 2024-08-05 ENCOUNTER — Ambulatory Visit
Admission: RE | Admit: 2024-08-05 | Discharge: 2024-08-05 | Disposition: A | Source: Ambulatory Visit | Attending: Family Medicine | Admitting: Family Medicine

## 2024-08-05 DIAGNOSIS — F0150 Vascular dementia without behavioral disturbance: Secondary | ICD-10-CM | POA: Insufficient documentation

## 2024-08-05 DIAGNOSIS — F0280 Dementia in other diseases classified elsewhere without behavioral disturbance: Secondary | ICD-10-CM | POA: Diagnosis present

## 2024-08-05 DIAGNOSIS — G309 Alzheimer's disease, unspecified: Secondary | ICD-10-CM | POA: Diagnosis present

## 2024-08-23 ENCOUNTER — Inpatient Hospital Stay: Admission: RE | Admit: 2024-08-23 | Discharge: 2024-08-23 | Attending: Family Medicine | Admitting: Family Medicine

## 2024-08-23 DIAGNOSIS — Z1231 Encounter for screening mammogram for malignant neoplasm of breast: Secondary | ICD-10-CM | POA: Diagnosis present
